# Patient Record
Sex: Male | Born: 1955 | Race: White | Hispanic: No | Smoking: Former smoker
Health system: Southern US, Community
[De-identification: ages and names within clinical notes are randomized; demographics above are authoritative.]

## PROBLEM LIST (undated history)

## (undated) DIAGNOSIS — G629 Polyneuropathy, unspecified: Secondary | ICD-10-CM

## (undated) DIAGNOSIS — J45909 Unspecified asthma, uncomplicated: Secondary | ICD-10-CM

## (undated) DIAGNOSIS — K219 Gastro-esophageal reflux disease without esophagitis: Secondary | ICD-10-CM

## (undated) HISTORY — PX: TONSILLECTOMY: SUR1361

---

## 1998-10-18 ENCOUNTER — Ambulatory Visit (HOSPITAL_COMMUNITY): Admission: RE | Admit: 1998-10-18 | Discharge: 1998-10-18 | Payer: Self-pay | Admitting: Psychiatry

## 1998-11-30 ENCOUNTER — Ambulatory Visit (HOSPITAL_COMMUNITY): Admission: RE | Admit: 1998-11-30 | Discharge: 1998-11-30 | Payer: Self-pay | Admitting: Psychiatry

## 1998-12-05 ENCOUNTER — Ambulatory Visit: Admission: RE | Admit: 1998-12-05 | Discharge: 1998-12-05 | Payer: Self-pay | Admitting: Psychiatry

## 1998-12-06 ENCOUNTER — Ambulatory Visit: Admission: RE | Admit: 1998-12-06 | Discharge: 1998-12-06 | Payer: Self-pay | Admitting: Psychiatry

## 2000-02-20 ENCOUNTER — Other Ambulatory Visit (HOSPITAL_COMMUNITY): Admission: RE | Admit: 2000-02-20 | Discharge: 2000-03-09 | Payer: Self-pay | Admitting: Psychiatry

## 2001-01-18 ENCOUNTER — Emergency Department (HOSPITAL_COMMUNITY): Admission: EM | Admit: 2001-01-18 | Discharge: 2001-01-18 | Payer: Self-pay | Admitting: Emergency Medicine

## 2003-04-22 ENCOUNTER — Encounter: Payer: Self-pay | Admitting: *Deleted

## 2003-04-22 ENCOUNTER — Emergency Department (HOSPITAL_COMMUNITY): Admission: EM | Admit: 2003-04-22 | Discharge: 2003-04-22 | Payer: Self-pay | Admitting: *Deleted

## 2010-02-26 ENCOUNTER — Emergency Department (HOSPITAL_COMMUNITY): Admission: EM | Admit: 2010-02-26 | Discharge: 2010-02-26 | Payer: Self-pay | Admitting: Emergency Medicine

## 2016-06-30 ENCOUNTER — Emergency Department (HOSPITAL_BASED_OUTPATIENT_CLINIC_OR_DEPARTMENT_OTHER): Payer: Self-pay

## 2016-06-30 ENCOUNTER — Emergency Department (HOSPITAL_BASED_OUTPATIENT_CLINIC_OR_DEPARTMENT_OTHER)
Admission: EM | Admit: 2016-06-30 | Discharge: 2016-06-30 | Disposition: A | Discharge status: Home / Self Care | Payer: Self-pay | Attending: Emergency Medicine | Admitting: Emergency Medicine

## 2016-06-30 DIAGNOSIS — Y9389 Activity, other specified: Secondary | ICD-10-CM | POA: Insufficient documentation

## 2016-06-30 DIAGNOSIS — S52552A Other extraarticular fracture of lower end of left radius, initial encounter for closed fracture: Secondary | ICD-10-CM | POA: Insufficient documentation

## 2016-06-30 DIAGNOSIS — W11XXXA Fall on and from ladder, initial encounter: Secondary | ICD-10-CM | POA: Insufficient documentation

## 2016-06-30 DIAGNOSIS — Y990 Civilian activity done for income or pay: Secondary | ICD-10-CM | POA: Insufficient documentation

## 2016-06-30 DIAGNOSIS — T148XXA Other injury of unspecified body region, initial encounter: Secondary | ICD-10-CM

## 2016-06-30 DIAGNOSIS — Z5181 Encounter for therapeutic drug level monitoring: Secondary | ICD-10-CM | POA: Insufficient documentation

## 2016-06-30 DIAGNOSIS — Y929 Unspecified place or not applicable: Secondary | ICD-10-CM | POA: Insufficient documentation

## 2016-06-30 DIAGNOSIS — W19XXXA Unspecified fall, initial encounter: Secondary | ICD-10-CM

## 2016-06-30 LAB — CBC WITH DIFFERENTIAL/PLATELET
BASOS PCT: 0 %
Basophils Absolute: 0 10*3/uL (ref 0.0–0.1)
EOS ABS: 0.1 10*3/uL (ref 0.0–0.7)
EOS PCT: 1 %
HCT: 45.3 % (ref 39.0–52.0)
HEMOGLOBIN: 16 g/dL (ref 13.0–17.0)
Lymphocytes Relative: 31 %
Lymphs Abs: 2.8 10*3/uL (ref 0.7–4.0)
MCH: 30.8 pg (ref 26.0–34.0)
MCHC: 35.3 g/dL (ref 30.0–36.0)
MCV: 87.1 fL (ref 78.0–100.0)
MONOS PCT: 7 %
Monocytes Absolute: 0.6 10*3/uL (ref 0.1–1.0)
NEUTROS PCT: 61 %
Neutro Abs: 5.4 10*3/uL (ref 1.7–7.7)
PLATELETS: 251 10*3/uL (ref 150–400)
RBC: 5.2 MIL/uL (ref 4.22–5.81)
RDW: 13.8 % (ref 11.5–15.5)
WBC: 9 10*3/uL (ref 4.0–10.5)

## 2016-06-30 LAB — BASIC METABOLIC PANEL
ANION GAP: 5 (ref 5–15)
BUN: 19 mg/dL (ref 6–20)
CALCIUM: 8.8 mg/dL — AB (ref 8.9–10.3)
CO2: 24 mmol/L (ref 22–32)
CREATININE: 1.13 mg/dL (ref 0.61–1.24)
Chloride: 107 mmol/L (ref 101–111)
GLUCOSE: 172 mg/dL — AB (ref 65–99)
Potassium: 4.2 mmol/L (ref 3.5–5.1)
Sodium: 136 mmol/L (ref 135–145)

## 2016-06-30 LAB — URINALYSIS, ROUTINE W REFLEX MICROSCOPIC
BILIRUBIN URINE: NEGATIVE
Glucose, UA: NEGATIVE mg/dL
HGB URINE DIPSTICK: NEGATIVE
KETONES UR: 15 mg/dL — AB
Leukocytes, UA: NEGATIVE
NITRITE: NEGATIVE
PROTEIN: NEGATIVE mg/dL
SPECIFIC GRAVITY, URINE: 1.023 (ref 1.005–1.030)
pH: 6.5 (ref 5.0–8.0)

## 2016-06-30 LAB — RAPID URINE DRUG SCREEN, HOSP PERFORMED
Amphetamines: NOT DETECTED
Barbiturates: NOT DETECTED
Benzodiazepines: NOT DETECTED
COCAINE: NOT DETECTED
OPIATES: POSITIVE — AB
TETRAHYDROCANNABINOL: POSITIVE — AB

## 2016-06-30 LAB — ETHANOL: Alcohol, Ethyl (B): <5 mg/dL (ref <5)

## 2016-06-30 LAB — TROPONIN I: Troponin I: <0.03 ng/mL (ref <0.03)

## 2016-06-30 MED ORDER — ACETAMINOPHEN 500 MG PO TABS: 1000.0000 mg | ORAL_TABLET | Freq: Four times a day (QID) | ORAL | 0 refills | Status: AC | PRN

## 2016-06-30 MED ORDER — TRAMADOL HCL 50 MG PO TABS: 100.0000 mg | ORAL_TABLET | Freq: Four times a day (QID) | ORAL | 0 refills | Status: AC | PRN

## 2016-06-30 MED ORDER — LIDOCAINE HCL (PF) 1 % IJ SOLN
INTRAMUSCULAR | Status: AC
  Filled 2016-06-30: qty 5

## 2016-06-30 MED ORDER — LIDOCAINE HCL (PF) 1 % IJ SOLN
10.0000 mL | Freq: Once | INTRAMUSCULAR | Status: AC
  Administered 2016-06-30: 10 mL

## 2016-06-30 MED ORDER — HYDROMORPHONE HCL 1 MG/ML IJ SOLN
1.0000 mg | Freq: Once | INTRAMUSCULAR | Status: AC
  Administered 2016-06-30: 1 mg via INTRAVENOUS
  Filled 2016-06-30: qty 1

## 2016-06-30 MED FILL — traMADol HCL 50 MG TABS: 50 | 2 days supply | Qty: 20 | Fill #0

## 2016-06-30 NOTE — ED Provider Notes (Signed)
[facility] Provider Note   CSN: [id number] Arrival date & time: 06/30/16  1404     History   Chief Complaint Chief Complaint  Patient presents with  . Fall    HPI Anthony Barton is a 60 y.o. male.  HPI Patient reports he was working in a foyer on a 20 foot ladder. He reports his feet were about 10 feet off the ground. The ladder started to slip and he was trying to support himself but could not stop the fall. He ended up falling and breaking his fall with his left arm. He has severe pain in his left wrist. He denies he has pain elsewhere. Patient denied loss of consciousness or striking his head. He denies chest pain or shortness of breath. He denies abdominal pain.  The bystander who was present reported differently that the patient after falling seemed confused and dazed. She is unsure if he has struck his head but was concerned that he might have a concussion based on his appearance. At the house they had gotten up and walk around the patient did not have any gait dysfunction She reports once they got to the hospital he was back to normal again. No past medical history on file.  There are no active problems to display for this patient.   No past surgical history on file.     Home Medications    Prior to Admission medications   Medication Sig Start Date End Date Taking? Authorizing Provider  acetaminophen (TYLENOL) 500 MG tablet Take 2 tablets (1,000 mg total) by mouth every 6 (six) hours as needed. 06/30/16   Arby Barrette, MD  traMADol (ULTRAM) 50 MG tablet Take 2 tablets (100 mg total) by mouth every 6 (six) hours as needed. 06/30/16   Arby Barrette, MD    Family History No family history on file.  Social History Social History  Substance Use Topics  . Smoking status: Not on file  . Smokeless tobacco: Not on file  . Alcohol use Not on file     Allergies   Patient has no known allergies.   Review of Systems Review of Systems 10 Systems  reviewed and are negative for acute change except as noted in the HPI.   Physical Exam Updated Vital Signs BP 162/95   Pulse 86   Temp 98.1 F (36.7 C) (Oral)   Resp 17   SpO2 100%   Physical Exam  Constitutional: He is oriented to person, place, and time. He appears well-developed and well-nourished.  Patient is alert and nontoxic. He has no respiratory distress.  HENT:  Head: Normocephalic and atraumatic.  Right Ear: External ear normal.  Left Ear: External ear normal.  Nose: Nose normal.  Mouth/Throat: Oropharynx is clear and moist.  Eyes: Conjunctivae and EOM are normal. Pupils are equal, round, and reactive to light.  Neck:  No C-spine tenderness to palpation.  Cardiovascular: Normal rate, regular rhythm, normal heart sounds and intact distal pulses.   Pulmonary/Chest: Effort normal and breath sounds normal. He exhibits no tenderness.  Abdominal: Soft. He exhibits no distension. There is no tenderness. There is no guarding.  Musculoskeletal:  Patient has obvious deformity of the left wrist with dinner fork deformity. Radial pulses intact. Patient is able to move his fingers. Remainder of extremities normal range of motion and no deformity.  Neurological: He is alert and oriented to person, place, and time. No cranial nerve deficit or sensory deficit. He exhibits normal muscle tone.  Skin: Skin is  warm and dry.  Psychiatric: He has a normal mood and affect.     ED Treatments / Results  Labs (all labs ordered are listed, but only abnormal results are displayed) Labs Reviewed  BASIC METABOLIC PANEL - Abnormal; Notable for the following:       Result Value   Glucose, Bld 172 (*)    Calcium 8.8 (*)    All other components within normal limits  ETHANOL  CBC WITH DIFFERENTIAL/PLATELET  TROPONIN I  URINALYSIS, ROUTINE W REFLEX MICROSCOPIC (NOT AT [facility])  RAPID URINE DRUG SCREEN, HOSP PERFORMED    EKG  EKG Interpretation  Date/Time:  Monday June 30 2016 14:19:32  EST Ventricular Rate:  101 PR Interval:    QRS Duration: 98 QT Interval:  347 QTC Calculation: 450 R Axis:   56 Text Interpretation:  Sinus tachycardia Ventricular premature complex Confirmed by Donnald GarrePfeiffer, MD, Lebron ConnersMarcy [phone]([id number]) on 06/30/2016 5:08:53 PM       Radiology Dg Chest 2 View  Result Date: 06/30/2016 CLINICAL DATA:  Fall. EXAM: CHEST  2 VIEW COMPARISON:  No recent prior. FINDINGS: Mediastinum and hilar structures normal. Lungs are clear. No pleural effusion or pneumothorax. Degenerative changes thoracic spine. IMPRESSION: No acute cardiopulmonary disease. Electronically Signed   By: Maisie Fushomas  Register   On: 06/30/2016 15:24   Dg Wrist 2 Views Left  Result Date: 06/30/2016 CLINICAL DATA:  Distal radial fracture status post reduction. EXAM: LEFT WRIST - 2 VIEW COMPARISON:  06/30/2016 FINDINGS: Comminuted fracture of the distal radial metaphysis with 7 mm of dorsal displacement. Surrounding soft tissue swelling. Small bony fragment along the dorsal aspect of the lunate which may reflect a small nondisplaced fracture of the lunate or triquetrum. Overlying soft tissue swelling. No other fracture or dislocation. IMPRESSION: 1. Comminuted fracture of the distal radial metaphysis with 7 mm of dorsal displacement. No significant interval change compared with with pre-reduction x-rays. 2. Small bony fragment along the dorsal aspect of the lunate which may reflect a small nondisplaced fracture of the lunate or triquetrum. Overlying soft tissue swelling. Electronically Signed   By: Elige KoHetal  Patel   On: 06/30/2016 16:40   Dg Wrist Complete Left  Result Date: 06/30/2016 CLINICAL DATA:  Left wrist pain after fall. EXAM: LEFT WRIST - COMPLETE 3+ VIEW COMPARISON:  None. FINDINGS: Comminuted fracture is seen involving the distal left radius with moderate dorsal angulation. No other definite fracture or dislocation is noted. Joint spaces are intact. IMPRESSION: Comminuted distal left radial fracture with dorsal  angulation. Electronically Signed   By: Lupita RaiderJames  Green Jr, M.D.   On: 06/30/2016 15:26    Procedures Reduction of fracture Date/Time: 06/30/2016 5:14 PM Performed by: Arby BarrettePFEIFFER, Rayner Erman Authorized by: Arby BarrettePFEIFFER, Conor Lata  Consent: Verbal consent obtained. Patient identity confirmed: verbally with patient Local anesthesia used: yes Anesthesia: hematoma block  Anesthesia: Local anesthesia used: yes Local Anesthetic: lidocaine 1% without epinephrine Anesthetic total: 10 mL  Sedation: Patient sedated: no Patient tolerance: Patient tolerated the procedure well with no immediate complications Comments: Postreduction x-ray reviewed by myself. Improved alignment. Splint placed with [employer]. Patient has good radial pulse and motor intact postreduction.    (including critical care time)  Medications Ordered in ED Medications  HYDROmorphone (DILAUDID) injection 1 mg (1 mg Intravenous Given 06/30/16 1438)  lidocaine (PF) (XYLOCAINE) 1 % injection 10 mL (10 mLs Other Given 06/30/16 1606)     Initial Impression / Assessment and Plan / ED Course  I have reviewed the triage vital signs and the nursing notes.  Pertinent labs & imaging results that were available during my care of the patient were reviewed by me and considered in my medical decision making (see chart for details).  Clinical Course      Final Clinical Impressions(s) / ED Diagnoses   Final diagnoses:  Other closed extra-articular fracture of distal end of left radius, initial encounter  Fall, initial encounter   Initially upon speaking the patient, he appeared to only have wrist injury from his fall. He did not recall striking his head and denied any headache. The bystander however reported that he had a period of confusion and although she did not see the fall, wondered if he had a concussion. For this reason, I did order CT scan of the head and the C-spine. The patient however refused these stating that he does not have symptoms  and does not suspect he has injury and is uninsured and thus cannot afford them. Patient has been in the emergency department and observed for several hours and shows no signs of confusion, he has not developed headache or any other neurologic symptoms. Patient opined that his behavior might of been in response to severe pain in his wrist. Patient shows no signs of intoxication. I do not suspect any alcohol or drug use. At this time, after observation, clinically patient does not show signs of head or neck injury. Patient's wrist is reduced and splinted, he is given instructions on follow-up with hand surgery. New Prescriptions New Prescriptions   ACETAMINOPHEN (TYLENOL) 500 MG TABLET    Take 2 tablets (1,000 mg total) by mouth every 6 (six) hours as needed.   TRAMADOL (ULTRAM) 50 MG TABLET    Take 2 tablets (100 mg total) by mouth every 6 (six) hours as needed.     Arby BarretteMarcy Aelyn Stanaland, MD 06/30/16 [id number]

## 2016-06-30 NOTE — ED Notes (Signed)
ED Provider along with EMT at bedside. Splinting.

## 2016-06-30 NOTE — ED Notes (Signed)
Pt dressed in paper scrubs. Belongings placed in personal bag.  Pt verbalized understanding of discharge instructions and denies any further questions at this time.

## 2016-06-30 NOTE — ED Notes (Signed)
Pt has been brought back from Radiology. Per [employer] Tech pt declines x-rays of knee and tib fib and CT scan at this time. EDP notified.

## 2016-07-02 ENCOUNTER — Ambulatory Visit (HOSPITAL_COMMUNITY): Payer: Self-pay | Admitting: Certified Registered Nurse Anesthetist

## 2016-07-02 ENCOUNTER — Encounter (HOSPITAL_COMMUNITY)
Admission: RE | Disposition: A | Discharge status: Home / Self Care | Payer: Self-pay | Source: Ambulatory Visit | Attending: Orthopedic Surgery

## 2016-07-02 ENCOUNTER — Encounter (HOSPITAL_COMMUNITY): Payer: Self-pay | Admitting: *Deleted

## 2016-07-02 ENCOUNTER — Ambulatory Visit (HOSPITAL_COMMUNITY)
Admission: RE | Admit: 2016-07-02 | Discharge: 2016-07-02 | Disposition: A | Discharge status: Home / Self Care | Payer: Self-pay | Source: Ambulatory Visit | Attending: Orthopedic Surgery | Admitting: Orthopedic Surgery

## 2016-07-02 DIAGNOSIS — W11XXXA Fall on and from ladder, initial encounter: Secondary | ICD-10-CM | POA: Insufficient documentation

## 2016-07-02 DIAGNOSIS — S52572A Other intraarticular fracture of lower end of left radius, initial encounter for closed fracture: Secondary | ICD-10-CM | POA: Insufficient documentation

## 2016-07-02 HISTORY — DX: Gastro-esophageal reflux disease without esophagitis: K21.9

## 2016-07-02 HISTORY — DX: Unspecified asthma, uncomplicated: J45.909

## 2016-07-02 HISTORY — PX: OPEN REDUCTION INTERNAL FIXATION (ORIF) DISTAL RADIAL FRACTURE: SHX5989

## 2016-07-02 HISTORY — DX: Polyneuropathy, unspecified: G62.9

## 2016-07-02 SURGERY — OPEN REDUCTION INTERNAL FIXATION (ORIF) DISTAL RADIUS FRACTURE
Anesthesia: Regional | Site: Wrist | Laterality: Left

## 2016-07-02 MED ORDER — PROPOFOL 500 MG/50ML IV EMUL
INTRAVENOUS | Status: DC | PRN
  Administered 2016-07-02: 200 ug/kg/min via INTRAVENOUS

## 2016-07-02 MED ORDER — BUPIVACAINE-EPINEPHRINE (PF) 0.5% -1:200000 IJ SOLN
INTRAMUSCULAR | Status: DC | PRN
  Administered 2016-07-02: 15 mL via PERINEURAL

## 2016-07-02 MED ORDER — 0.9 % SODIUM CHLORIDE (POUR BTL) OPTIME
TOPICAL | Status: DC | PRN
  Administered 2016-07-02: 1000 mL

## 2016-07-02 MED ORDER — OXYCODONE-ACETAMINOPHEN 5-325 MG PO TABS: 1.0000 | ORAL_TABLET | Freq: Three times a day (TID) | ORAL | 0 refills | Status: AC

## 2016-07-02 MED ORDER — CEFAZOLIN SODIUM-DEXTROSE 2-4 GM/100ML-% IV SOLN
2.0000 g | INTRAVENOUS | Status: AC
  Administered 2016-07-02: 2 g via INTRAVENOUS
  Filled 2016-07-02: qty 100

## 2016-07-02 MED ORDER — ROPIVACAINE HCL 7.5 MG/ML IJ SOLN
INTRAMUSCULAR | Status: DC | PRN
  Administered 2016-07-02: 15 mL via PERINEURAL

## 2016-07-02 MED ORDER — CHLORHEXIDINE GLUCONATE 4 % EX LIQD: 60.0000 mL | Freq: Once | CUTANEOUS | Status: DC

## 2016-07-02 MED ORDER — BUPIVACAINE HCL (PF) 0.25 % IJ SOLN
INTRAMUSCULAR | Status: AC
  Filled 2016-07-02: qty 30

## 2016-07-02 MED ORDER — METHOCARBAMOL 500 MG PO TABS: 500.0000 mg | ORAL_TABLET | Freq: Four times a day (QID) | ORAL | 0 refills | Status: AC

## 2016-07-02 MED ORDER — LACTATED RINGERS IV SOLN
INTRAVENOUS | Status: DC
Start: 2016-07-02 — End: 2016-07-02
  Administered 2016-07-02 (×2): via INTRAVENOUS

## 2016-07-02 MED ORDER — HYDROMORPHONE HCL 1 MG/ML IJ SOLN: 0.2500 mg | INTRAMUSCULAR | Status: DC | PRN

## 2016-07-02 MED ORDER — LIDOCAINE 2% (20 MG/ML) 5 ML SYRINGE
INTRAMUSCULAR | Status: DC | PRN
  Administered 2016-07-02: 60 mg via INTRAVENOUS

## 2016-07-02 MED ORDER — DOCUSATE SODIUM 100 MG PO CAPS: 100.0000 mg | ORAL_CAPSULE | Freq: Two times a day (BID) | ORAL | 0 refills | Status: AC

## 2016-07-02 MED ORDER — OXYCODONE HCL 5 MG/5ML PO SOLN: 5.0000 mg | Freq: Once | ORAL | Status: DC | PRN

## 2016-07-02 MED ORDER — BUPIVACAINE HCL (PF) 0.25 % IJ SOLN
INTRAMUSCULAR | Status: DC | PRN
  Administered 2016-07-02: .09 mL

## 2016-07-02 MED ORDER — VITAMIN C 500 MG PO TABS
500.0000 mg | ORAL_TABLET | Freq: Every day | ORAL | 0 refills | Status: AC
Start: 2016-07-02 — End: ?

## 2016-07-02 MED ORDER — EPHEDRINE SULFATE 50 MG/ML IJ SOLN
INTRAMUSCULAR | Status: DC | PRN
  Administered 2016-07-02: 10 mg via INTRAVENOUS
  Administered 2016-07-02: 5 mg via INTRAVENOUS

## 2016-07-02 MED ORDER — OXYCODONE HCL 5 MG PO TABS: 5.0000 mg | ORAL_TABLET | Freq: Once | ORAL | Status: DC | PRN

## 2016-07-02 MED ORDER — FENTANYL CITRATE (PF) 100 MCG/2ML IJ SOLN
100.0000 ug | Freq: Once | INTRAMUSCULAR | Status: AC
  Administered 2016-07-02: 100 ug via INTRAVENOUS

## 2016-07-02 SURGICAL SUPPLY — 66 items
BANDAGE ACE 4X5 VEL STRL LF (GAUZE/BANDAGES/DRESSINGS) ×3 IMPLANT
BANDAGE ELASTIC 3 VELCRO ST LF (GAUZE/BANDAGES/DRESSINGS) ×3 IMPLANT
BIT DRILL 2.2 SS TIBIAL (BIT) ×2 IMPLANT
BLADE SURG ROTATE 9660 (MISCELLANEOUS) IMPLANT
BNDG CMPR 9X4 STRL LF SNTH (GAUZE/BANDAGES/DRESSINGS) ×1
BNDG ESMARK 4X9 LF (GAUZE/BANDAGES/DRESSINGS) ×3 IMPLANT
BNDG GAUZE ELAST 4 BULKY (GAUZE/BANDAGES/DRESSINGS) ×3 IMPLANT
CANISTER SUCTION 2500CC (MISCELLANEOUS) ×3 IMPLANT
CORDS BIPOLAR (ELECTRODE) ×3 IMPLANT
COVER SURGICAL LIGHT HANDLE (MISCELLANEOUS) ×3 IMPLANT
CUFF TOURNIQUET SINGLE 18IN (TOURNIQUET CUFF) ×3 IMPLANT
CUFF TOURNIQUET SINGLE 24IN (TOURNIQUET CUFF) IMPLANT
DECANTER SPIKE VIAL GLASS SM (MISCELLANEOUS) ×3 IMPLANT
DRAPE OEC MINIVIEW 54X84 (DRAPES) ×3 IMPLANT
DRAPE SURG 17X11 SM STRL (DRAPES) ×3 IMPLANT
DRSG ADAPTIC 3X8 NADH LF (GAUZE/BANDAGES/DRESSINGS) ×3 IMPLANT
GAUZE SPONGE 4X4 12PLY STRL (GAUZE/BANDAGES/DRESSINGS) ×3 IMPLANT
GAUZE SPONGE 4X4 16PLY XRAY LF (GAUZE/BANDAGES/DRESSINGS) ×3 IMPLANT
GAUZE XEROFORM 1X8 LF (GAUZE/BANDAGES/DRESSINGS) ×2 IMPLANT
GLOVE BIOGEL PI IND STRL 8.5 (GLOVE) ×1 IMPLANT
GLOVE BIOGEL PI INDICATOR 8.5 (GLOVE) ×2
GLOVE ECLIPSE 7.0 STRL STRAW (GLOVE) ×2 IMPLANT
GLOVE SURG ORTHO 8.0 STRL STRW (GLOVE) ×3 IMPLANT
GOWN STRL REUS W/ TWL LRG LVL3 (GOWN DISPOSABLE) ×1 IMPLANT
GOWN STRL REUS W/ TWL XL LVL3 (GOWN DISPOSABLE) ×1 IMPLANT
GOWN STRL REUS W/TWL LRG LVL3 (GOWN DISPOSABLE) ×3
GOWN STRL REUS W/TWL XL LVL3 (GOWN DISPOSABLE) ×3
K-WIRE 1.6 (WIRE) ×3
K-WIRE FX5X1.6XNS BN SS (WIRE) ×1
KIT BASIN OR (CUSTOM PROCEDURE TRAY) ×3 IMPLANT
KIT ROOM TURNOVER OR (KITS) ×3 IMPLANT
KWIRE FX5X1.6XNS BN SS (WIRE) IMPLANT
NDL HYPO 25GX1X1/2 BEV (NEEDLE) IMPLANT
NDL HYPO 25X1 1.5 SAFETY (NEEDLE) ×1 IMPLANT
NEEDLE HYPO 25GX1X1/2 BEV (NEEDLE) ×3 IMPLANT
NEEDLE HYPO 25X1 1.5 SAFETY (NEEDLE) ×3 IMPLANT
NS IRRIG 1000ML POUR BTL (IV SOLUTION) ×3 IMPLANT
PACK ORTHO EXTREMITY (CUSTOM PROCEDURE TRAY) ×3 IMPLANT
PAD ARMBOARD 7.5X6 YLW CONV (MISCELLANEOUS) ×6 IMPLANT
PAD CAST 4YDX4 CTTN HI CHSV (CAST SUPPLIES) ×1 IMPLANT
PADDING CAST COTTON 4X4 STRL (CAST SUPPLIES) ×3
PEG LOCKING SMOOTH 2.2X18 (Peg) ×6 IMPLANT
PEG LOCKING SMOOTH 2.2X22 (Screw) ×4 IMPLANT
PEG LOCKING SMOOTH 2.2X24 (Peg) ×2 IMPLANT
PLATE STD DVR LEFT (Plate) ×3 IMPLANT
PLATE STD DVR LT 24X55 (Plate) IMPLANT
SCREW LOCK 14X2.7X 3 LD TPR (Screw) IMPLANT
SCREW LOCK 16X2.7X 3 LD TPR (Screw) IMPLANT
SCREW LOCKING 2.7X14 (Screw) ×3 IMPLANT
SCREW LOCKING 2.7X15MM (Screw) ×6 IMPLANT
SCREW LOCKING 2.7X16 (Screw) ×3 IMPLANT
SCREW MULTI DIRECTIONAL 2.7X20 (Screw) ×2 IMPLANT
SCREW MULTI DIRECTIONAL 2.7X22 (Screw) ×2 IMPLANT
SOAP 2 % CHG 4 OZ (WOUND CARE) ×3 IMPLANT
SPLINT FIBERGLASS 3X35 (CAST SUPPLIES) ×2 IMPLANT
SPONGE LAP 4X18 X RAY DECT (DISPOSABLE) ×3 IMPLANT
SUT VIC AB 2-0 FS1 27 (SUTURE) ×2 IMPLANT
SUT VICRYL 4-0 PS2 18IN ABS (SUTURE) ×2 IMPLANT
SUT VICRYL RAPIDE 4/0 PS 2 (SUTURE) ×4 IMPLANT
SYR CONTROL 10ML LL (SYRINGE) ×2 IMPLANT
TOWEL OR 17X24 6PK STRL BLUE (TOWEL DISPOSABLE) ×3 IMPLANT
TOWEL OR 17X26 10 PK STRL BLUE (TOWEL DISPOSABLE) ×3 IMPLANT
TUBE CONNECTING 12'X1/4 (SUCTIONS) ×1
TUBE CONNECTING 12X1/4 (SUCTIONS) ×2 IMPLANT
WATER STERILE IRR 1000ML POUR (IV SOLUTION) ×3 IMPLANT
YANKAUER SUCT BULB TIP NO VENT (SUCTIONS) ×2 IMPLANT

## 2016-07-02 NOTE — Progress Notes (Signed)
Orthopedic Tech Progress Note Patient Details:  Anthony Barton 05/27/1956 [id number]  Ortho Devices Type of Ortho Device: Arm sling Ortho Device/Splint Interventions: Casandra DoffingOrdered   Binyamin Nelis Craig 07/02/2016, 7:08 PM

## 2016-07-02 NOTE — Transfer of Care (Signed)
Immediate Anesthesia Transfer of Care Note  Patient: Anthony Barton  Procedure(s) Performed: Procedure(s): OPEN REDUCTION INTERNAL FIXATION (ORIF) DISTAL RADIAL FRACTURE (Left)  Patient Location: PACU  Anesthesia Type:MAC combined with regional for post-op pain  Level of Consciousness: awake, alert , oriented and patient cooperative  Airway & Oxygen Therapy: Patient Spontanous Breathing and Patient connected to face mask oxygen  Post-op Assessment: Report given to RN and Post -op Vital signs reviewed and stable  Post vital signs: Reviewed and stable  Last Vitals:  Vitals:   07/02/16 1600 07/02/16 1817  BP: (!) 174/73   Pulse: 63   Resp: 18   Temp: 36.7 C 36.4 C    Last Pain:  Vitals:   07/02/16 1600  TempSrc: Oral      Patients Stated Pain Goal: 7 (07/02/16 1610)  Complications: No apparent anesthesia complications

## 2016-07-02 NOTE — Anesthesia Preprocedure Evaluation (Addendum)
Anesthesia Evaluation  Patient identified by MRN, date of birth, ID band Patient awake    Reviewed: Allergy & Precautions, NPO status , Patient's Chart, lab work & pertinent test results  History of Anesthesia Complications Negative for: history of anesthetic complications  Airway Mallampati: II  TM Distance: >3 FB Neck ROM: Full    Dental  (+) Teeth Intact   Pulmonary neg shortness of breath, neg sleep apnea, neg COPD, neg recent URI, former smoker,    breath sounds clear to auscultation       Cardiovascular negative cardio ROS   Rhythm:Regular     Neuro/Psych  Neuromuscular disease negative psych ROS   GI/Hepatic Neg liver ROS, GERD  ,  Endo/Other  negative endocrine ROS  Renal/GU negative Renal ROS     Musculoskeletal   Abdominal   Peds  Hematology   Anesthesia Other Findings   Reproductive/Obstetrics                            Anesthesia Physical Anesthesia Plan  ASA: II  Anesthesia Plan: Regional   Post-op Pain Management:    Induction:   Airway Management Planned: Natural Airway, Nasal Cannula and Simple Face Mask  Additional Equipment: None  Intra-op Plan:   Post-operative Plan: Extubation in OR  Informed Consent: I have reviewed the patients History and Physical, chart, labs and discussed the procedure including the risks, benefits and alternatives for the proposed anesthesia with the patient or authorized representative who has indicated his/her understanding and acceptance.   Dental advisory given  Plan Discussed with: CRNA and Surgeon  Anesthesia Plan Comments:        Anesthesia Quick Evaluation

## 2016-07-02 NOTE — H&P (Signed)
Anthony Barton is an 60 y.o. male.   Chief Complaint: COLLES FRACTURE OF THE LEFT DISTAL RADIUS HPI: PATIENT FELL OFF A LADDER ON 06/30/16. SEEN AT THE ED THAT DAY AND WAS PUT IN A SPLINT. SEEN IN THE OFFICE 07/02/16. REPEAT RADIOGRAPHS DONE. PATIENT IS HERE TODAY FOR SURGERY.  No past medical history on file.  No past surgical history on file.  No family history on file. Social History:  has no tobacco, alcohol, and drug history on file.  Allergies: No Known Allergies  No prescriptions prior to admission.    Results for orders placed or performed during the hospital encounter of 06/30/16 (from the past 48 hour(s))  Urinalysis, Routine w reflex microscopic     Status: Abnormal   Collection Time: 06/30/16  5:00 PM  Result Value Ref Range   Color, Urine YELLOW YELLOW   APPearance CLEAR CLEAR   Specific Gravity, Urine 1.023 1.005 - 1.030   pH 6.5 5.0 - 8.0   Glucose, UA NEGATIVE NEGATIVE mg/dL   Hgb urine dipstick NEGATIVE NEGATIVE   Bilirubin Urine NEGATIVE NEGATIVE   Ketones, ur 15 (A) NEGATIVE mg/dL   Protein, ur NEGATIVE NEGATIVE mg/dL   Nitrite NEGATIVE NEGATIVE   Leukocytes, UA NEGATIVE NEGATIVE    Comment: MICROSCOPIC NOT DONE ON URINES WITH NEGATIVE PROTEIN, BLOOD, LEUKOCYTES, NITRITE, OR GLUCOSE <1000 mg/dL.  Urine rapid drug screen (hosp performed)     Status: Abnormal   Collection Time: 06/30/16  5:00 PM  Result Value Ref Range   Opiates POSITIVE (A) NONE DETECTED   Cocaine NONE DETECTED NONE DETECTED   Benzodiazepines NONE DETECTED NONE DETECTED   Amphetamines NONE DETECTED NONE DETECTED   Tetrahydrocannabinol POSITIVE (A) NONE DETECTED   Barbiturates NONE DETECTED NONE DETECTED    Comment:        DRUG SCREEN FOR MEDICAL PURPOSES ONLY.  IF CONFIRMATION IS NEEDED FOR ANY PURPOSE, NOTIFY LAB WITHIN 5 DAYS.        LOWEST DETECTABLE LIMITS FOR URINE DRUG SCREEN Drug Class       Cutoff (ng/mL) Amphetamine      1000 Barbiturate      200 Benzodiazepine    200 Tricyclics       300 Opiates          300 Cocaine          300 THC              50    Dg Wrist 2 Views Left  Result Date: 06/30/2016 CLINICAL DATA:  Distal radial fracture status post reduction. EXAM: LEFT WRIST - 2 VIEW COMPARISON:  06/30/2016 FINDINGS: Comminuted fracture of the distal radial metaphysis with 7 mm of dorsal displacement. Surrounding soft tissue swelling. Small bony fragment along the dorsal aspect of the lunate which may reflect a small nondisplaced fracture of the lunate or triquetrum. Overlying soft tissue swelling. No other fracture or dislocation. IMPRESSION: 1. Comminuted fracture of the distal radial metaphysis with 7 mm of dorsal displacement. No significant interval change compared with with pre-reduction x-rays. 2. Small bony fragment along the dorsal aspect of the lunate which may reflect a small nondisplaced fracture of the lunate or triquetrum. Overlying soft tissue swelling. Electronically Signed   By: Elige KoHetal  Patel   On: 06/30/2016 16:40    ROS NO RECENT ILLNESSES OR HOSPITALIZATIONS.  There were no vitals taken for this visit. Physical Exam  General Appearance:  Alert, cooperative, no distress, appears stated age  Head:  Normocephalic, without obvious abnormality, atraumatic  Eyes:  Pupils equal, conjunctiva/corneas clear,         Throat: Lips, mucosa, and tongue normal; teeth and gums normal  Neck: No visible masses     Lungs:   respirations unlabored  Chest Wall:  No tenderness or deformity  Heart:  Regular rate and rhythm,  Abdomen:   Soft, non-tender,         Extremities: LUE: SKIN INTACT, ABLE TO EXTEND THUMB AND WIGGLE FINGERS FINGERS WARM WELL PERFUSED SPLINT IN PLACE  Pulses: 2+ and symmetric  Skin: Skin color, texture, turgor normal, no rashes or lesions     Neurologic: Normal    Assessment COMMINUTED INTRAARTICULAR  FRACTURE OF THE LEFT DISTAL RADIUS  Plan OPEN REDUCTION AND INTERNAL FIXATION OF THE LEFT DISTAL RADIUS R/B/A  DISCUSSED WITH PT IN OFFICE.  PT VOICED UNDERSTANDING OF PLAN CONSENT SIGNED DAY OF SURGERY PT SEEN AND EXAMINED PRIOR TO OPERATIVE PROCEDURE/DAY OF SURGERY SITE MARKED. QUESTIONS ANSWERED WILL GO HOME FOLLOWING SURGERY  WE ARE PLANNING SURGERY FOR YOUR UPPER EXTREMITY. THE RISKS AND BENEFITS OF SURGERY INCLUDE BUT NOT LIMITED TO BLEEDING INFECTION, DAMAGE TO NEARBY NERVES ARTERIES TENDONS, FAILURE OF SURGERY TO ACCOMPLISH ITS INTENDED GOALS, PERSISTENT SYMPTOMS AND NEED FOR FURTHER SURGICAL INTERVENTION. WITH THIS IN MIND WE WILL PROCEED. I HAVE DISCUSSED WITH THE PATIENT THE PRE AND POSTOPERATIVE REGIMEN AND THE DOS AND DON'TS. PT VOICED UNDERSTANDING AND INFORMED CONSENT SIGNED.   Anthony GreaserSamantha Bonham Barton 07/02/2016, 3:21 PM

## 2016-07-02 NOTE — Anesthesia Procedure Notes (Signed)
Procedure Name: MAC Date/Time: 07/02/2016 4:51 PM Performed by: Lucinda DellECARLO, Fernado Brigante M Pre-anesthesia Checklist: Patient identified, Suction available, Patient being monitored and Emergency Drugs available Oxygen Delivery Method: Simple face mask

## 2016-07-02 NOTE — Anesthesia Procedure Notes (Addendum)
Anesthesia Regional Block:  Supraclavicular block  Pre-Anesthetic Checklist: ,, timeout performed, Correct Patient, Correct Site, Correct Laterality, Correct Procedure, Correct Position, site marked, Risks and benefits discussed,  Surgical consent,  Pre-op evaluation,  At surgeon's request and post-op pain management  Laterality: Upper and Left  Prep: chloraprep       Needles:  Injection technique: Single-shot  Needle Type: Echogenic Needle          Additional Needles:  Procedures: ultrasound guided (picture in chart) Supraclavicular block Narrative:  Start time: 07/02/2016 4:36 PM End time: 07/02/2016 4:45 PM Injection made incrementally with aspirations every 5 mL.  Performed by: Personally   Additional Notes: H+P and labs reviewed, risks and benefits discussed with patient, procedure tolerated well without complications

## 2016-07-02 NOTE — Discharge Instructions (Signed)
KEEP BANDAGE CLEAN AND DRY CALL OFFICE FOR F/U APPT [phone] IN 14 DAYS KEEP HAND ELEVATED ABOVE HEART OK TO APPLY ICE TO OPERATIVE AREA CONTACT OFFICE IF ANY WORSENING PAIN OR CONCERNS. 

## 2016-07-02 NOTE — Anesthesia Postprocedure Evaluation (Signed)
Anesthesia Post Note  Patient: Anthony Barton  Procedure(s) Performed: Procedure(s) (LRB): OPEN REDUCTION INTERNAL FIXATION (ORIF) DISTAL RADIAL FRACTURE (Left)  Patient location during evaluation: PACU Anesthesia Type: Regional Level of consciousness: awake and alert, oriented and patient cooperative Pain management: pain level controlled Vital Signs Assessment: post-procedure vital signs reviewed and stable Respiratory status: spontaneous breathing, nonlabored ventilation and respiratory function stable Cardiovascular status: blood pressure returned to baseline and stable Postop Assessment: no signs of nausea or vomiting Anesthetic complications: no    Last Vitals:  Vitals:   07/02/16 1900 07/02/16 1905  BP: (!) 171/66 (!) 171/66  Pulse: 61 (!) 58  Resp: 11 11  Temp:  36.4 C    Last Pain:  Vitals:   07/02/16 1845  TempSrc:   PainSc: 0-No pain                 Shaniyah Wix,E. Jaree Trinka

## 2016-07-03 ENCOUNTER — Encounter (HOSPITAL_COMMUNITY): Payer: Self-pay | Admitting: Orthopedic Surgery

## 2016-07-03 NOTE — Op Note (Signed)
NAME:  Anthony Barton, Anthony Barton           ACCOUNT NO.:  [id number]  MEDICAL RECORD NO.:  [id number]  LOCATION:                                 FACILITY:  PHYSICIAN:  Sharma CovertFred W. Torry Adamczak IV, M.D.DATE OF BIRTH:  July 05, 1956  DATE OF PROCEDURE:  07/02/2016 DATE OF DISCHARGE:  07/02/2016                              OPERATIVE REPORT   PREOPERATIVE DIAGNOSIS:  Left wrist intra-articular distal radius fracture, 3 or more fragments.  POSTOPERATIVE DIAGNOSIS:  Left wrist intra-articular distal radius fracture, 3 or more fragments.  ATTENDING SURGEON:  Sharma CovertFred W. Leni Pankonin, MD, who scrubbed and present for the entire procedure.  ASSISTANT SURGEON:  None.  ANESTHESIA:  Supraclavicular block with IV sedation.  SURGICAL PROCEDURE: 1. Open treatment of left wrist intra-articular distal radius     fracture, 3 or more fragments. 2. Left wrist brachioradialis tendon released forearm flexor tenotomy. 3. Radiographs 3 views, left wrist.  SURGICAL INDICATIONS:  Anthony Barton is a right-hand dominant gentleman who sustained a comminuted intra-articular distal radius fracture.  The patient was seen and evaluated in the office and recommended to undergo the above procedure.  Risks, benefits, and alternatives were discussed in detail with the patient.  Risks include, but not limited to bleeding, infection, damage to nearby nerves, arteries, or tendons; loss of motion to the wrist and digits, incomplete relief of symptoms, and need for further surgical intervention.  DESCRIPTION OF PROCEDURE:  The patient was properly identified in the preoperative holding area, mark with a permanent marker made on the left wrist to indicate the correct operative site.  The patient was then brought back to the operating room, placed supine on the anesthesia room table where the IV sedation was administered.  The patient tolerated this well.  A well-padded tourniquet was placed on the left brachium and sealed with 1000 drape.   Left upper extremity was then prepped and draped in normal sterile fashion.  Time-out was called, correct site was identified, and procedure then begun.  Attention was then turned to the left wrist where a longitudinal incision was made directly over the FCR sheath after prep and drape.  Tourniquet had been insufflated. Dissection carried down through the skin and subcutaneous tissue.  The FCR sheath was then opened proximally and distally.  Going through the floor of the FCR sheath, deep dissection was carried down sweeping the FPL all the way and the pronator quadratus was elevated in an L-shaped fashion.  Following this, the brachioradialis was carefully released and tendon tenotomy was then carried out releasing of the radial styloid to expose the intra-articular fracture and reduce the intra-articular fragments radially.  The remaining intra-articular fragments were then carefully reduced and volar rim plate was then applied.  Plate height was then adjusted and confirmed using mini C-arm.  The oblong screw hole was then placed proximally.  The wound was then thoroughly irrigated. Distal fixation was carried out with the distal locking pegs from an ulnar to radial direction with the combination of distal locking pegs and variable angle screws.  The proximal fixation was then carried out with locking and nonlocking screws.  The wound was irrigated, final radiographs were then obtained.  The pronator quadratus was closed with  2-0 Vicryl, subcutaneous tissue was closed with 4-0 Vicryl, skin closed with 4-0 Vicryl Rapide.  Xeroform dressing and sterile compressive bandage were then applied.  The patient was then placed in a well-padded sugar-tong splint, taken to the recovery room in good condition.  INTRAOPERATIVE RADIOGRAPHS:  AP, lateral, and oblique views of the wrist did show the volar plate fixation in place in good position.  SURGICAL IMPLANTS:  Biomet DVR Cross-Lock volar rim  plate standard.  POSTPROCEDURAL PLAN:  The patient discharged home, seen back in the office in approximately 2 weeks for wound check, application of short- arm cast.  Follow up at the 4-week mark for x-rays, x-rays out of the cast and then begin an outpatient therapy regimen.  Radiographs at each visit.     Madelynn DoneFred W. Freya Zobrist IV, M.D.   ______________________________ Madelynn DoneFred W. Wing Gfeller IV, M.D.    FWO/MEDQ  D:  07/02/2016  T:  07/03/2016  Job:  [id number]

## 2016-07-03 NOTE — Op Note (Deleted)
  The note originally documented on this encounter has been moved the the encounter in which it belongs.  

## 2016-08-04 ENCOUNTER — Ambulatory Visit: Payer: Self-pay | Admitting: *Deleted

## 2016-08-12 ENCOUNTER — Ambulatory Visit: Payer: BLUE CROSS/BLUE SHIELD | Attending: Orthopedic Surgery | Admitting: Occupational Therapy

## 2016-08-12 DIAGNOSIS — M25522 Pain in left elbow: Secondary | ICD-10-CM | POA: Diagnosis present

## 2016-08-12 DIAGNOSIS — R600 Localized edema: Secondary | ICD-10-CM | POA: Diagnosis present

## 2016-08-12 DIAGNOSIS — M25532 Pain in left wrist: Secondary | ICD-10-CM

## 2016-08-12 DIAGNOSIS — M6281 Muscle weakness (generalized): Secondary | ICD-10-CM | POA: Diagnosis present

## 2016-08-12 DIAGNOSIS — M25632 Stiffness of left wrist, not elsewhere classified: Secondary | ICD-10-CM

## 2016-08-12 DIAGNOSIS — G90522 Complex regional pain syndrome I of left lower limb: Secondary | ICD-10-CM | POA: Insufficient documentation

## 2016-08-12 NOTE — Therapy (Signed)
[facility] [address] [place], [place], [address] Phone: [phone]   Fax:  [phone]  Occupational Therapy Evaluation  Patient Details  Name: Anthony Barton MRN: [id number] Date of Birth: Mar 07, 1956 Referring Provider: Dr. Melvyn Novas  Encounter Date: 08/12/2016      OT End of Session - 08/12/16 0947    Visit Number 1   Number of Visits 12   Authorization Type BC/BS? or self pay - pt unsure   OT Start Time 0800   OT Stop Time 0850   OT Time Calculation (min) 50 min   Activity Tolerance Patient tolerated treatment well      Past Medical History:  Diagnosis Date  . Asthma   . GERD (gastroesophageal reflux disease)   . Peripheral neuropathy (HCC)    mild    Past Surgical History:  Procedure Laterality Date  . OPEN REDUCTION INTERNAL FIXATION (ORIF) DISTAL RADIAL FRACTURE Left 07/02/2016   Procedure: OPEN REDUCTION INTERNAL FIXATION (ORIF) DISTAL RADIAL FRACTURE;  Surgeon: Bradly Bienenstock, MD;  [facility]: [facility] OR;  Service: Orthopedics;  Laterality: Left;  . TONSILLECTOMY      There were no vitals filed for this visit.      Subjective Assessment - 08/12/16 0810    Subjective  I've been lifting ladders and I tried to lift a 8 lb dumbbell. I have to lift for my job   Currently in Pain? No/denies  Not during eval, but does report pain at times along forearm and sometimes up to shoulder   Pain Orientation Left   Pain Descriptors / Indicators Radiating;Dull   Pain Type Acute pain   Pain Onset More than a month ago   Pain Frequency Intermittent   Aggravating Factors  certain movements   Pain Relieving Factors rest           [facility] OT Assessment - 08/12/16 0001      Assessment   Diagnosis s/p ORIF Lt wrist Colle's fracture   Referring Provider Dr. Melvyn Novas   Onset Date 07/02/16   Assessment Pt arrived with pre-fab wrist splint. Pt reports MD instructed him to wear when active. Pt reports he has already  begun lifting for work - therapist cautioned him not to yet, but pt reports he has to get back to work d/t financial reasons.    Prior Therapy none     Precautions   Precautions Other (comment)   Precaution Comments no strengthening   Required Braces or Orthoses Other Brace/Splint   Other Brace/Splint Pt to wear pre-fab brace when pt is active     Balance Screen   Has the patient fallen in the past 6 months Yes   How many times? 1   Has the patient had a decrease in activity level because of a fear of falling?  No   Is the patient reluctant to leave their home because of a fear of falling?  No     Home  Environment   Lives With Alone     Prior Function   Level of Independence Independent   Vocation Self employed   Chartered certified accountant     ADL   Eating/Feeding Independent   Grooming Independent   Comptroller - Conservator, museum/gallery -  Tour manager Independent   ADL comments  Independent with all IADLS including driving     Mobility   Mobility Status Independent     Written Expression   Dominant Hand Right     Vision - History   Baseline Vision No visual deficits   Additional Comments wears reading glasses     Sensation   Additional Comments Pt reports numbness along median n. distribution     Coordination   9 Hole Peg Test Right;Left   Right 9 Hole Peg Test 23.78 sec   Left 9 Hole Peg Test 23.78 sec     Edema   Edema moderate Lt wrist     ROM / Strength   AROM / PROM / Strength AROM     AROM   Overall AROM Comments BUE AROM WFL's except: Lt supination = 83*, Wrist flex = 45*, ext = 35*, RD = 15*, UD = 26*. Pt also lacks full end range in elbow flexion d/t pain and therapist recommended calling MD about elbow pain                    OT Treatments/Exercises (OP) - 08/12/16 0001      ADLs   ADL Comments Pt shown scar massage and how to perform, and benefits      Exercises   Exercises Wrist     Wrist Exercises   Other wrist exercises Pt issued A/ROM wrist and forearm HEP - pt return demo of each               OT Education - 08/12/16 0848    Education provided Yes   Education Details scar massage, A/ROM HEP    Person(s) Educated Patient   Methods Explanation;Demonstration;Handout   Comprehension Verbalized understanding;Returned demonstration          OT Short Term Goals - 08/12/16 0954      OT SHORT TERM GOAL #1   Title Independent with initial A/ROM HEP    Time 3   Period Weeks   Status On-going     OT SHORT TERM GOAL #2   Title Independent with scar massage   Time 3   Period Weeks   Status On-going           OT Long Term Goals - 08/12/16 0955      OT LONG TERM GOAL #1   Title Independent with updated HEP    Time 6   Period Weeks   Status New     OT LONG TERM GOAL #2   Title Pt to increase wrist flexion and extension by 10* or greater for functional tasks   Time 6   Period Weeks   Status New     OT LONG TERM GOAL #3   Title Pt to demo 50% or greater grip strength Lt hand as compared to that of Rt hand for work related tasks   Time 6   Period Weeks   Status New     OT LONG TERM GOAL #4   Title Pt to report pain no greater than 2/10 with lifting tasks   Baseline     Time 6   Period Weeks   Status New     OT LONG TERM GOAL #5   Title Pt to return to using Lt hand as assist for all work related tasks   Time 6   Period Weeks   Status New               Plan - 08/12/16 6578  Clinical Impression Statement Pt is a 61 y.o. male who presents to outpatient rehab s/p ORIF Lt radius fx (Colles fx) on 07/02/16. Original injury 06/30/16 from fall off ladder. Pt now with increased edema, pain, and decreased sensation, ROM, and strength LUE and would benefit from  skilled O.T. to address these deficits   OT Frequency 2x / week   OT Duration 6 weeks   OT Treatment/Interventions Self-care/ADL training;Moist Heat;Fluidtherapy;DME and/or AE instruction;Splinting;Patient/family education;Therapeutic exercises;Contrast Bath;Ultrasound;Therapeutic activities;Scar mobilization;Cryotherapy;Passive range of motion;Parrafin;Electrical Stimulation;Manual Therapy   Plan fluidotherapy, review A/ROM and scar massage, forearm gym, supinator roller   Consulted and Agree with Plan of Care Patient      Patient will benefit from skilled therapeutic intervention in order to improve the following deficits and impairments:  Decreased range of motion, Increased edema, Impaired sensation, Decreased activity tolerance, Decreased knowledge of precautions, Decreased skin integrity, Impaired UE functional use, Pain, Decreased strength, Impaired perceived functional ability  Visit Diagnosis: Pain in left wrist - Plan: Ot plan of care cert/re-cert  Pain in left elbow - Plan: Ot plan of care cert/re-cert  Stiffness of left wrist, not elsewhere classified - Plan: Ot plan of care cert/re-cert  Localized edema - Plan: Ot plan of care cert/re-cert  Muscle weakness (generalized) - Plan: Ot plan of care cert/re-cert    Problem List There are no active problems to display for this patient.   Kelli ChurnBallie, Raequan Vanschaick Johnson, OTR/L 08/12/2016, 10:15 AM  [facility] [address] [place], [place], [address] Phone: [phone]   Fax:  [phone]  Name: Anthony Barton MRN: [id number] Date of Birth: 05/02/1956

## 2016-08-12 NOTE — Patient Instructions (Signed)
  SCAR MASSAGE:   Perform 2x/day for 5 minutes  Use cocoa butter or Vitamin E cream along incision massaging in deep circles  AROM: Wrist Extension   .  With __Lt__ palm down, bend wrist up. Repeat __15__ times per set.  Do __4-6__ sessions per day.    AROM: Wrist Flexion   With__Lt___ palm up, bend wrist up. Repeat __15__ times per set.  Do _4-6___ sessions per day.   AROM: Forearm Pronation / Supination   With _Lt___ arm in handshake position, slowly rotate palm down until stretch is felt. Relax. Then rotate palm up until stretch is felt. Repeat _15___ times per set. Do _4-6___ sessions per day. (elbows bent)  AROM: Wrist Radial / Ulnar Deviation    Gently bend left wrist from side to side as far as possible. Repeat _15___ times per set.  Do __4-6__ sessions per day.

## 2016-08-19 ENCOUNTER — Encounter: Payer: Self-pay | Admitting: Occupational Therapy

## 2016-08-19 ENCOUNTER — Ambulatory Visit: Payer: BLUE CROSS/BLUE SHIELD | Admitting: Occupational Therapy

## 2016-08-19 DIAGNOSIS — R600 Localized edema: Secondary | ICD-10-CM

## 2016-08-19 DIAGNOSIS — M25532 Pain in left wrist: Secondary | ICD-10-CM

## 2016-08-19 DIAGNOSIS — M6281 Muscle weakness (generalized): Secondary | ICD-10-CM

## 2016-08-19 DIAGNOSIS — M25632 Stiffness of left wrist, not elsewhere classified: Secondary | ICD-10-CM

## 2016-08-19 DIAGNOSIS — M25522 Pain in left elbow: Secondary | ICD-10-CM

## 2016-08-19 NOTE — Therapy (Signed)
[facility] [address] [place], [place], [address] Phone: [phone]   Fax:  [phone]  Occupational Therapy Treatment  Patient Details  Name: Anthony Barton MRN: [id number] Date of Birth: 12/05/1955 Referring Provider: Dr. Melvyn Novasrtmann  Encounter Date: 08/19/2016      OT End of Session - 08/19/16 1310    Visit Number 2   Number of Visits 12   Authorization Type BC/BS? or self pay - pt unsure   OT Start Time [phone]   OT Stop Time 1014   OT Time Calculation (min) 43 min   Activity Tolerance Patient tolerated treatment well      Past Medical History:  Diagnosis Date  . Asthma   . GERD (gastroesophageal reflux disease)   . Peripheral neuropathy (HCC)    mild    Past Surgical History:  Procedure Laterality Date  . OPEN REDUCTION INTERNAL FIXATION (ORIF) DISTAL RADIAL FRACTURE Left 07/02/2016   Procedure: OPEN REDUCTION INTERNAL FIXATION (ORIF) DISTAL RADIAL FRACTURE;  Surgeon: Bradly BienenstockFred Ortmann, MD;  Location: [facility] OR;  Service: Orthopedics;  Laterality: Left;  . TONSILLECTOMY      There were no vitals filed for this visit.      Subjective Assessment - 08/19/16 0931    Subjective  I overdid a little yesterday so I am a little sore today.    Pertinent History see epic , L colles fx/, s/p ORIF 07/02/2016   Currently in Pain? Yes   Pain Score 2    Pain Location Wrist   Pain Orientation Left                      OT Treatments/Exercises (OP) - 08/19/16 0001      Wrist Exercises   Other wrist exercises AAROM (gentle) for wrist flexion, extension, ulnar deviation and radial deviation x10 reps each. with 10 second hold with each rep. Also addressed supination with supination wheel.  Pt states "I don't have time during the day to do this." Discussed that pt could do prior to work , lunch, when he makes work phone calls and at night. Pt verbalized understanding and stated he could do that. Also reinforced  that pt should not be using weights at home and pt stated he stopped but does have to use his hand for work to hold items for extended periods of time that weigh about 2 pounds - pt is wearing splint doing active work.       Modalities   Modalities Fluidotherapy     LUE Fluidotherapy   Number Minutes Fluidotherapy 12 Minutes   LUE Fluidotherapy Location Wrist   Comments Tolerated well - pain decreased from 2/10 to 1/10 by end of treatment.      Manual Therapy   Manual Therapy Other (comment)   Manual therapy comments Scar massage - pt tolerated well and states he is doing this at home intermittently.                   OT Short Term Goals - 08/19/16 1309      OT SHORT TERM GOAL #1   Title Independent with initial A/ROM HEP    Time 3   Period Weeks   Status Achieved     OT SHORT TERM GOAL #2   Title Independent with scar massage   Time 3   Period Weeks   Status Achieved           OT Long Term Goals - 08/19/16  1309      OT LONG TERM GOAL #1   Title Independent with updated HEP    Time 6   Period Weeks   Status New     OT LONG TERM GOAL #2   Title Pt to increase wrist flexion and extension by 10* or greater for functional tasks   Time 6   Period Weeks   Status New     OT LONG TERM GOAL #3   Title Pt to demo 50% or greater grip strength Lt hand as compared to that of Rt hand for work related tasks   Time 6   Period Weeks   Status New     OT LONG TERM GOAL #4   Title Pt to report pain no greater than 2/10 with lifting tasks   Baseline     Time 6   Period Weeks   Status New     OT LONG TERM GOAL #5   Title Pt to return to using Lt hand as assist for all work related tasks   Time 6   Period Weeks   Status New               Plan - 08/19/16 1309    Clinical Impression Statement Pt progressing toward goal. Reinforced no weights and importance of consistency in HEP.    OT Frequency 2x / week   OT Duration 6 weeks   OT  Treatment/Interventions Self-care/ADL training;Moist Heat;Fluidtherapy;DME and/or AE instruction;Splinting;Patient/family education;Therapeutic exercises;Contrast Bath;Ultrasound;Therapeutic activities;Scar mobilization;Cryotherapy;Passive range of motion;Parrafin;Electrical Stimulation;Manual Therapy   Plan fluidotherapy, AAROM and scar managemen, forearm gym, supinator roller   Consulted and Agree with Plan of Care Patient      Patient will benefit from skilled therapeutic intervention in order to improve the following deficits and impairments:  Decreased range of motion, Increased edema, Impaired sensation, Decreased activity tolerance, Decreased knowledge of precautions, Decreased skin integrity, Impaired UE functional use, Pain, Decreased strength, Impaired perceived functional ability  Visit Diagnosis: Pain in left wrist  Pain in left elbow  Stiffness of left wrist, not elsewhere classified  Localized edema  Muscle weakness (generalized)    Problem List There are no active problems to display for this patient.   Norton Pastel, OTR/L 08/19/2016, 1:12 PM  [facility] [address] [place], [place], [address] Phone: [phone]   Fax:  [phone]  Name: Anthony Barton MRN: [id number] Date of Birth: 1956-04-11

## 2016-08-26 ENCOUNTER — Ambulatory Visit: Payer: BLUE CROSS/BLUE SHIELD | Admitting: Occupational Therapy

## 2016-08-26 DIAGNOSIS — M25532 Pain in left wrist: Secondary | ICD-10-CM | POA: Diagnosis not present

## 2016-08-26 DIAGNOSIS — M25632 Stiffness of left wrist, not elsewhere classified: Secondary | ICD-10-CM

## 2016-08-26 DIAGNOSIS — R600 Localized edema: Secondary | ICD-10-CM

## 2016-08-26 NOTE — Therapy (Signed)
[facility] [address] [place], [place], [address] Phone: [phone]   Fax:  [phone]  Occupational Therapy Treatment  Patient Details  Name: Anthony Barton MRN: [id number] Date of Birth: 1955/08/03 Referring Provider: Dr. Melvyn Novas  Encounter Date: 08/26/2016      OT End of Session - 08/26/16 1009    Visit Number 3   Number of Visits 12   Authorization Type BC/BS? or self pay - pt unsure   OT Start Time 0930   OT Stop Time 1015   OT Time Calculation (min) 45 min   Activity Tolerance Patient tolerated treatment well      Past Medical History:  Diagnosis Date  . Asthma   . GERD (gastroesophageal reflux disease)   . Peripheral neuropathy (HCC)    mild    Past Surgical History:  Procedure Laterality Date  . OPEN REDUCTION INTERNAL FIXATION (ORIF) DISTAL RADIAL FRACTURE Left 07/02/2016   Procedure: OPEN REDUCTION INTERNAL FIXATION (ORIF) DISTAL RADIAL FRACTURE;  Surgeon: Bradly Bienenstock, MD;  Location: [facility];  Service: Orthopedics;  Laterality: Left;  . TONSILLECTOMY      There were no vitals filed for this visit.      Subjective Assessment - 08/26/16 0938    Subjective  I don't have any pain now, just certain movements cause pain   Pertinent History see epic , L colles fx/, s/p ORIF 07/02/2016   Patient Stated Goals Get my motion back in my Lt wrist and go back to the gym again   Currently in Pain? Yes   Pain Score 2    Pain Location Wrist  ulnar side   Pain Orientation Left   Pain Descriptors / Indicators Sharp   Pain Type Acute pain   Pain Onset More than a month ago   Pain Frequency Intermittent   Aggravating Factors  certain movements   Pain Relieving Factors rest, heat                      OT Treatments/Exercises (OP) - 08/26/16 0001      Wrist Exercises   Other wrist exercises Reviewed A/ROM HEP and emphasized doing at least 4x/day (before work, during lunch break, right  after work, and right before bedtime). Pt performed wrist flex, ext, RD, UD, and sup/pronation to x 15 reps each   Other wrist exercises Forearm gym x 4 full revolutions for combined wrist and forearm movement. Supinator roller x 10 reps for sup/pron. Wrist winder (without weight) x 2 full windups     LUE Fluidotherapy   Number Minutes Fluidotherapy 10 Minutes   LUE Fluidotherapy Location Hand;Wrist   Comments at beginning of session to decr. stiffness                  OT Short Term Goals - 08/19/16 1309      OT SHORT TERM GOAL #1   Title Independent with initial A/ROM HEP    Time 3   Period Weeks   Status Achieved     OT SHORT TERM GOAL #2   Title Independent with scar massage   Time 3   Period Weeks   Status Achieved           OT Long Term Goals - 08/19/16 1309      OT LONG TERM GOAL #1   Title Independent with updated HEP    Time 6   Period Weeks   Status New     OT  LONG TERM GOAL #2   Title Pt to increase wrist flexion and extension by 10* or greater for functional tasks   Time 6   Period Weeks   Status New     OT LONG TERM GOAL #3   Title Pt to demo 50% or greater grip strength Lt hand as compared to that of Rt hand for work related tasks   Time 6   Period Weeks   Status New     OT LONG TERM GOAL #4   Title Pt to report pain no greater than 2/10 with lifting tasks   Baseline     Time 6   Period Weeks   Status New     OT LONG TERM GOAL #5   Title Pt to return to using Lt hand as assist for all work related tasks   Time 6   Period Weeks   Status New               Plan - 08/26/16 1010    Clinical Impression Statement Pt making progress with ROM Lt wrist and only mild pain at times.    OT Frequency 2x / week   OT Duration 6 weeks   OT Treatment/Interventions Self-care/ADL training;Moist Heat;Fluidtherapy;DME and/or AE instruction;Splinting;Patient/family education;Therapeutic exercises;Contrast Bath;Ultrasound;Therapeutic  activities;Scar mobilization;Cryotherapy;Passive range of motion;Parrafin;Electrical Stimulation;Manual Therapy   Plan fluidotherapy, begin P/ROM and light strengthening with putty and 1-2 lb dumbbell per protocol (as pt will be 8 weeks post-op tomorrow)    Consulted and Agree with Plan of Care Patient      Patient will benefit from skilled therapeutic intervention in order to improve the following deficits and impairments:  Decreased range of motion, Increased edema, Impaired sensation, Decreased activity tolerance, Decreased knowledge of precautions, Decreased skin integrity, Impaired UE functional use, Pain, Decreased strength, Impaired perceived functional ability  Visit Diagnosis: Pain in left wrist  Stiffness of left wrist, not elsewhere classified  Localized edema    Problem List There are no active problems to display for this patient.   Kelli ChurnBallie, Kameran Lallier Johnson, OTR/L 08/26/2016, 10:15 AM  [facility] [address] [place], [place], [address] Phone: [phone]   Fax:  [phone]  Name: Anthony Barton MRN: [id number] Date of Birth: 08/11/1955

## 2016-09-02 ENCOUNTER — Ambulatory Visit: Payer: BLUE CROSS/BLUE SHIELD | Attending: Orthopedic Surgery | Admitting: Occupational Therapy

## 2016-09-02 DIAGNOSIS — M6281 Muscle weakness (generalized): Secondary | ICD-10-CM | POA: Diagnosis present

## 2016-09-02 DIAGNOSIS — M25632 Stiffness of left wrist, not elsewhere classified: Secondary | ICD-10-CM | POA: Diagnosis present

## 2016-09-02 DIAGNOSIS — M25532 Pain in left wrist: Secondary | ICD-10-CM | POA: Diagnosis present

## 2016-09-02 NOTE — Therapy (Signed)
[place] [address] [place], [place], [address] Phone: [phone]   Fax:  [phone]  Occupational Therapy Treatment  Patient Details  Name: Anthony Barton MRN: [id number] Date of Birth: 1955/12/31 Referring Provider: Dr. Caralyn Guile  Encounter Date: 09/02/2016      OT End of Session - 09/02/16 1015    Visit Number 4   Number of Visits 12   Authorization Type BC/BS? or self pay - pt unsure   OT Start Time 0930   OT Stop Time 1015   OT Time Calculation (min) 45 min   Equipment Utilized During Treatment fluidotherapy   Activity Tolerance Patient tolerated treatment well      Past Medical History:  Diagnosis Date  . Asthma   . GERD (gastroesophageal reflux disease)   . Peripheral neuropathy (HCC)    mild    Past Surgical History:  Procedure Laterality Date  . OPEN REDUCTION INTERNAL FIXATION (ORIF) DISTAL RADIAL FRACTURE Left 07/02/2016   Procedure: OPEN REDUCTION INTERNAL FIXATION (ORIF) DISTAL RADIAL FRACTURE;  Surgeon: Iran Planas, MD;  Location: [place];  Service: Orthopedics;  Laterality: Left;  . TONSILLECTOMY      There were no vitals filed for this visit.      Subjective Assessment - 09/02/16 0937    Subjective  The doctor said it was healing nicely, but not to lift more than 5-7 lbs.    Pertinent History see epic , L colles fx/, s/p ORIF 07/02/2016   Patient Stated Goals Get my motion back in my Lt wrist and go back to the gym again   Currently in Pain? No/denies            [facility] OT Assessment - 09/02/16 0001      AROM   Overall AROM Comments supination = 85*, wrist flex = 52*, ext = 55*, RD = 18*, UD = 30*     Hand Function   Right Hand Grip (lbs) 85 lbs   Left Hand Grip (lbs) 45 lbs                  OT Treatments/Exercises (OP) - 09/02/16 0001      Exercises   Exercises Hand     Wrist Exercises   Other wrist exercises P/ROM HEP issued for wrist flex, ext, and  supination - see pt instructions for details   Other wrist exercises Wrist strengthening HEP issued for wrist flex, ext, and RD - See pt instructions for details. Also performed hammer stretch in sup/pron. x 10 reps holding 5 sec. each way. Forearm gym x 4 full revolutions     Hand Exercises   Theraputty - Grip x 15 reps (issued as HEP). Issued red putty for home use   Theraputty - Pinch x 10 reps (issued as HEP)     LUE Fluidotherapy   Number Minutes Fluidotherapy 12 Minutes   LUE Fluidotherapy Location Hand;Wrist   Comments at beginning of session to decr. stiffness and for desensitization                OT Education - 09/02/16 0948    Education provided Yes   Education Details P/ROM HEP, strengthening HEP for wrist and hand   Person(s) Educated Patient   Methods Explanation;Demonstration;Handout   Comprehension Verbalized understanding;Returned demonstration          OT Short Term Goals - 08/19/16 1309      OT SHORT TERM GOAL #1   Title Independent with initial A/ROM  HEP    Time 3   Period Weeks   Status Achieved     OT SHORT TERM GOAL #2   Title Independent with scar massage   Time 3   Period Weeks   Status Achieved           OT Long Term Goals - 09/02/16 1016      OT LONG TERM GOAL #1   Title Independent with updated HEP    Time 6   Period Weeks   Status On-going     OT LONG TERM GOAL #2   Title Pt to increase wrist flexion and extension by 10* or greater for functional tasks   Time 6   Period Weeks   Status On-going  met in extension, improving in flex     OT LONG TERM GOAL #3   Title Pt to demo 50% or greater grip strength Lt hand as compared to that of Rt hand for work related tasks   Time 6   Period Weeks   Status On-going     OT LONG TERM GOAL #4   Title Pt to report pain no greater than 2/10 with lifting tasks   Baseline     Time 6   Period Weeks   Status On-going     OT LONG TERM GOAL #5   Title Pt to return to using Lt hand  as assist for all work related tasks   Time 6   Period Weeks   Status On-going               Plan - 09/02/16 1017    Clinical Impression Statement Pt progressing per protocol with no incr. in pain or complications. Pt has improved in wrist flexion and extension   Rehab Potential Good   OT Frequency 2x / week   OT Duration 6 weeks   OT Treatment/Interventions Self-care/ADL training;Moist Heat;Fluidtherapy;DME and/or AE instruction;Splinting;Patient/family education;Therapeutic exercises;Contrast Bath;Ultrasound;Therapeutic activities;Scar mobilization;Cryotherapy;Passive range of motion;Parrafin;Electrical Stimulation;Manual Therapy   Plan continue fluido, review P/ROM and strengthening HEP, wrist winder with 2 lbs, hammer stretch (next week - update to green putty)    Consulted and Agree with Plan of Care Patient      Patient will benefit from skilled therapeutic intervention in order to improve the following deficits and impairments:  Decreased range of motion, Increased edema, Impaired sensation, Decreased activity tolerance, Decreased knowledge of precautions, Decreased skin integrity, Impaired UE functional use, Pain, Decreased strength, Impaired perceived functional ability  Visit Diagnosis: Stiffness of left wrist, not elsewhere classified  Muscle weakness (generalized)    Problem List There are no active problems to display for this patient.   Carey Bullocks, OTR/L 09/02/2016, 10:19 AM  [place] [address] [place], [place], [address] Phone: [phone]   Fax:  [phone]  Name: Anthony Barton MRN: [id number] Date of Birth: 1955-09-29

## 2016-09-02 NOTE — Patient Instructions (Signed)
   PROM: Wrist Flexion / Extension   Grasp  hand and slowly bend wrist until stretch is felt. Relax. Then stretch as far as possible in opposite direction. Be sure to keep elbow bent.  Hold __10__ sec. each way Repeat _5___ times per set.    Do _4-6___ sessions per day. (Can do "prayer stretch" in place of top picture. Have elbow straight for bottom picture)     Supination (Passive)   Keep elbow bent at right angle and held firmly at side. Use other hand to turn forearm until palm faces upward. Hold __10__ seconds. Repeat __5__ times. Do _4-6___ sessions per day.   Extension (Resistive)    With wrist over edge of table, lift _1-2 lbs, keeping arm on table surface. Hold _3___ seconds. Lower slowly. Repeat __10-15__ times. Do __2__ sessions per day.   Flexion (Resistive)    With hand palm-up and holding _1-2 lbs, bend hand toward you at wrist. Hold _3___ seconds. Relax slowly. Repeat _10-15___ times. Do _2___ sessions per day.   Wrist Radial Deviation: Resisted    With left thumb up, _1-2___ pound weight in hand, bend wrist up. Return slowly. Repeat _10-15___ times per set.  Do __2__ sessions per day.    1. Grip Strengthening (Resistive Putty)   Squeeze putty using thumb and all fingers. Repeat _15__ times. Do __2__ sessions per day.   2. Roll putty into tube on table and pinch between each finger and thumb x 10 reps each. (Do ring and small finger together)

## 2016-09-09 ENCOUNTER — Ambulatory Visit: Payer: BLUE CROSS/BLUE SHIELD | Admitting: Occupational Therapy

## 2016-09-09 DIAGNOSIS — M6281 Muscle weakness (generalized): Secondary | ICD-10-CM

## 2016-09-09 DIAGNOSIS — M25632 Stiffness of left wrist, not elsewhere classified: Secondary | ICD-10-CM

## 2016-09-09 DIAGNOSIS — M25532 Pain in left wrist: Secondary | ICD-10-CM

## 2016-09-09 NOTE — Therapy (Signed)
[facility] [address] [place], [place], [address] Phone: [phone]   Fax:  [phone]  Occupational Therapy Treatment  Patient Details  Name: Anthony Barton MRN: [id number] Date of Birth: 1956/05/20 Referring Provider: Dr. Caralyn Guile  Encounter Date: 09/09/2016      OT End of Session - 09/09/16 1010    Visit Number 5   Number of Visits 12   Authorization Type BC/BS? or self pay - pt unsure   OT Start Time 0935   OT Stop Time 1015   OT Time Calculation (min) 40 min   Equipment Utilized During Treatment fluidotherapy   Activity Tolerance Patient tolerated treatment well      Past Medical History:  Diagnosis Date  . Asthma   . GERD (gastroesophageal reflux disease)   . Peripheral neuropathy (HCC)    mild    Past Surgical History:  Procedure Laterality Date  . OPEN REDUCTION INTERNAL FIXATION (ORIF) DISTAL RADIAL FRACTURE Left 07/02/2016   Procedure: OPEN REDUCTION INTERNAL FIXATION (ORIF) DISTAL RADIAL FRACTURE;  Surgeon: Iran Planas, MD;  Location: [facility];  Service: Orthopedics;  Laterality: Left;  . TONSILLECTOMY      There were no vitals filed for this visit.      Subjective Assessment - 09/09/16 0943    Subjective  I'm doing my exercises   Pertinent History see epic , L colles fx/, s/p ORIF 07/02/2016   Patient Stated Goals Get my motion back in my Lt wrist and go back to the gym again   Currently in Pain? Yes   Pain Score 2    Pain Location Wrist  radial side today   Pain Orientation Left   Pain Descriptors / Indicators Sore   Pain Type Acute pain   Pain Onset More than a month ago   Pain Frequency Intermittent   Aggravating Factors  certain movements   Pain Relieving Factors rest, heat                      OT Treatments/Exercises (OP) - 09/09/16 0001      Wrist Exercises   Other wrist exercises Reviewed P/ROM and strengthening HEP for wrist/forearm - pt did each as  indicated   Other wrist exercises Wrist winder x 4 full revolutions with 2 lb. weight      Hand Exercises   Other Hand Exercises Updated putty HEP to green resistance and issued green putty for home use     LUE Fluidotherapy   Number Minutes Fluidotherapy 10 Minutes   LUE Fluidotherapy Location Hand;Wrist   Comments at beginning of session to decr. stiffness and mild pain                  OT Short Term Goals - 08/19/16 1309      OT SHORT TERM GOAL #1   Title Independent with initial A/ROM HEP    Time 3   Period Weeks   Status Achieved     OT SHORT TERM GOAL #2   Title Independent with scar massage   Time 3   Period Weeks   Status Achieved           OT Long Term Goals - 09/09/16 1011      OT LONG TERM GOAL #1   Title Independent with updated HEP    Time 6   Period Weeks   Status Achieved     OT LONG TERM GOAL #2   Title Pt to increase  wrist flexion and extension by 10* or greater for functional tasks   Time 6   Period Weeks   Status On-going  met in extension, improving in flex     OT LONG TERM GOAL #3   Title Pt to demo 50% or greater grip strength Lt hand as compared to that of Rt hand for work related tasks   Time 6   Period Weeks   Status Achieved  09/02/16: Rt = 85 lbs, Lt = 45 lbs     OT LONG TERM GOAL #4   Title Pt to report pain no greater than 2/10 with lifting tasks   Baseline     Time 6   Period Weeks   Status On-going     OT LONG TERM GOAL #5   Title Pt to return to using Lt hand as assist for all work related tasks   Time 6   Period Weeks   Status On-going               Plan - 09/09/16 1012    Clinical Impression Statement Pt met LTG's #1 and #3. Pt approximating remaining LTG's   Rehab Potential Good   OT Frequency 2x / week   OT Duration 6 weeks   OT Treatment/Interventions Self-care/ADL training;Moist Heat;Fluidtherapy;DME and/or AE instruction;Splinting;Patient/family education;Therapeutic exercises;Contrast  Bath;Ultrasound;Therapeutic activities;Scar mobilization;Cryotherapy;Passive range of motion;Parrafin;Electrical Stimulation;Manual Therapy   Plan pt to return in 2 weeks for last appt - gripper activity, re-assess grip strength, check remaining goals and d/c   Consulted and Agree with Plan of Care Patient      Patient will benefit from skilled therapeutic intervention in order to improve the following deficits and impairments:  Decreased range of motion, Increased edema, Impaired sensation, Decreased activity tolerance, Decreased knowledge of precautions, Decreased skin integrity, Impaired UE functional use, Pain, Decreased strength, Impaired perceived functional ability  Visit Diagnosis: Stiffness of left wrist, not elsewhere classified  Muscle weakness (generalized)  Pain in left wrist    Problem List There are no active problems to display for this patient.   Carey Bullocks, OTR/L 09/09/2016, 10:15 AM  [facility] [address] [place], [place], [address] Phone: [phone]   Fax:  [phone]  Name: Anthony Barton MRN: [id number] Date of Birth: 1956/03/09

## 2016-09-23 ENCOUNTER — Ambulatory Visit: Payer: BLUE CROSS/BLUE SHIELD | Admitting: Occupational Therapy

## 2016-09-23 DIAGNOSIS — M25632 Stiffness of left wrist, not elsewhere classified: Secondary | ICD-10-CM

## 2016-09-23 DIAGNOSIS — M6281 Muscle weakness (generalized): Secondary | ICD-10-CM

## 2016-09-23 NOTE — Therapy (Signed)
[facility] [address] [place], [place], [address] Phone: [phone]   Fax:  [phone]  Occupational Therapy Treatment  Patient Details  Name: Anthony Barton MRN: [id number] Date of Birth: Nov 27, 1955 Referring Provider: Dr. Caralyn Guile  Encounter Date: 09/23/2016      OT End of Session - 09/23/16 1058    Visit Number 6   Number of Visits 12   Authorization Type BC/BS? or self pay - pt unsure   OT Start Time 1020   OT Stop Time 1100   OT Time Calculation (min) 40 min   Activity Tolerance Patient tolerated treatment well      Past Medical History:  Diagnosis Date  . Asthma   . GERD (gastroesophageal reflux disease)   . Peripheral neuropathy (HCC)    mild    Past Surgical History:  Procedure Laterality Date  . OPEN REDUCTION INTERNAL FIXATION (ORIF) DISTAL RADIAL FRACTURE Left 07/02/2016   Procedure: OPEN REDUCTION INTERNAL FIXATION (ORIF) DISTAL RADIAL FRACTURE;  Surgeon: Iran Planas, MD;  Location: [facility];  Service: Orthopedics;  Laterality: Left;  . TONSILLECTOMY      There were no vitals filed for this visit.      Subjective Assessment - 09/23/16 1026    Subjective  I really haven't had any pain   Pertinent History see epic , L colles fx/, s/p ORIF 07/02/2016   Patient Stated Goals Get my motion back in my Lt wrist and go back to the gym again   Currently in Pain? No/denies            [facility] OT Assessment - 09/23/16 0001      AROM   Overall AROM Comments Lt wrist flex = 60*, ext = 70*, RD = 18*, 38*     Hand Function   Left Hand Grip (lbs) 62                  OT Treatments/Exercises (OP) - 09/23/16 0001      ADLs   ADL Comments Assessed LTG's and progress to date and reviewed progress with patient     Wrist Exercises   Other wrist exercises Wrist flex, ext, and RD x 15 reps each with 3 lb. weight     Hand Exercises   Other Hand Exercises Gripper set at level 3 resistance  to pick up blocks for sustained grip strength with mod drops and difficulty as pt fatigues   Other Hand Exercises Pinch strength activity with clothespins yellow to black resistance d/t thumb weakness                  OT Short Term Goals - 08/19/16 1309      OT SHORT TERM GOAL #1   Title Independent with initial A/ROM HEP    Time 3   Period Weeks   Status Achieved     OT SHORT TERM GOAL #2   Title Independent with scar massage   Time 3   Period Weeks   Status Achieved           OT Long Term Goals - 09/23/16 1047      OT LONG TERM GOAL #1   Title Independent with updated HEP    Time 6   Period Weeks   Status Achieved     OT LONG TERM GOAL #2   Title Pt to increase wrist flexion and extension by 10* or greater for functional tasks   Time 6   Period Weeks  Status Achieved  09/23/16: wrist flex = 60*, ext = 70*     OT LONG TERM GOAL #3   Title Pt to demo 50% or greater grip strength Lt hand as compared to that of Rt hand for work related tasks   Time 6   Period Weeks   Status Achieved  09/02/16: Rt = 85 lbs, Lt = 45 lbs. 09/23/16: Lt = 62 lbs     OT LONG TERM GOAL #4   Title Pt to report pain no greater than 2/10 with lifting tasks   Baseline     Time 6   Period Weeks   Status Achieved     OT LONG TERM GOAL #5   Title Pt to return to using Lt hand as assist for all work related tasks   Time 6   Period Weeks   Status Achieved  except lifting large paint buckets Lt hand               Plan - 09/23/16 1059    Clinical Impression Statement Pt met all LTG's. Pt has progressed with Lt wrist ROM, and wrist and hand strength.    OT Treatment/Interventions Self-care/ADL training;Moist Heat;Fluidtherapy;DME and/or AE instruction;Splinting;Patient/family education;Therapeutic exercises;Contrast Bath;Ultrasound;Therapeutic activities;Scar mobilization;Cryotherapy;Passive range of motion;Parrafin;Electrical Stimulation;Manual Therapy   Plan D/C O.T.     Consulted and Agree with Plan of Care Patient      Patient will benefit from skilled therapeutic intervention in order to improve the following deficits and impairments:  Decreased range of motion, Increased edema, Impaired sensation, Decreased activity tolerance, Decreased knowledge of precautions, Decreased skin integrity, Impaired UE functional use, Pain, Decreased strength, Impaired perceived functional ability  Visit Diagnosis: Stiffness of left wrist, not elsewhere classified  Muscle weakness (generalized)    Problem List There are no active problems to display for this patient.  OCCUPATIONAL THERAPY DISCHARGE SUMMARY  Visits from Start of Care: 6  Current functional level related to goals / functional outcomes: SEE ABOVE   Remaining deficits: Mild strength deficits Lt wrist and hand   Education / Equipment: HEP's, scar massage, initial precautions  Plan: Patient agrees to discharge.  Patient goals were met. Patient is being discharged due to meeting the stated rehab goals.  ?????        Carey Bullocks, OTR/L 09/23/2016, 11:00 AM  [facility] [address] [place], [place], [address] Phone: [phone]   Fax:  [phone]  Name: Anthony Barton MRN: [id number] Date of Birth: 1955/11/28

## 2017-12-18 IMAGING — DX DG WRIST COMPLETE 3+V*L*
4 series · 4 of 4 positions shown · non-contrast
Comparison: None.

CLINICAL DATA: Left wrist pain after fall.

EXAM:
LEFT WRIST - COMPLETE 3+ VIEW

[wrist pa]
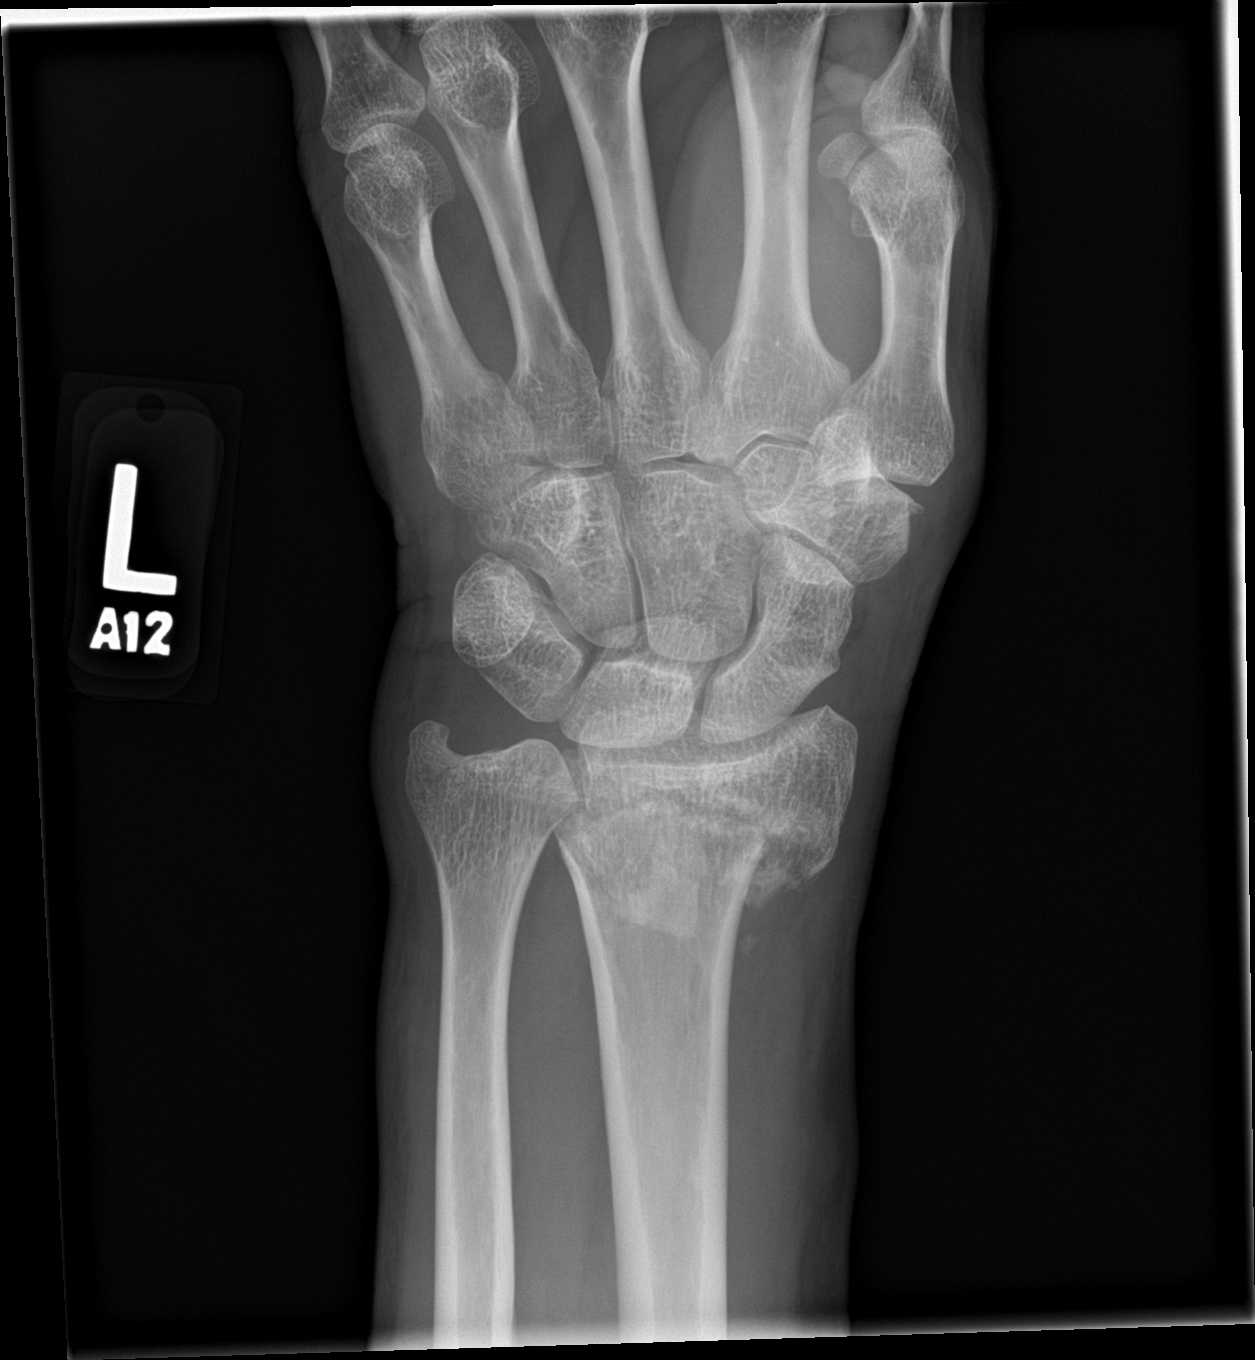

[wrist obl]
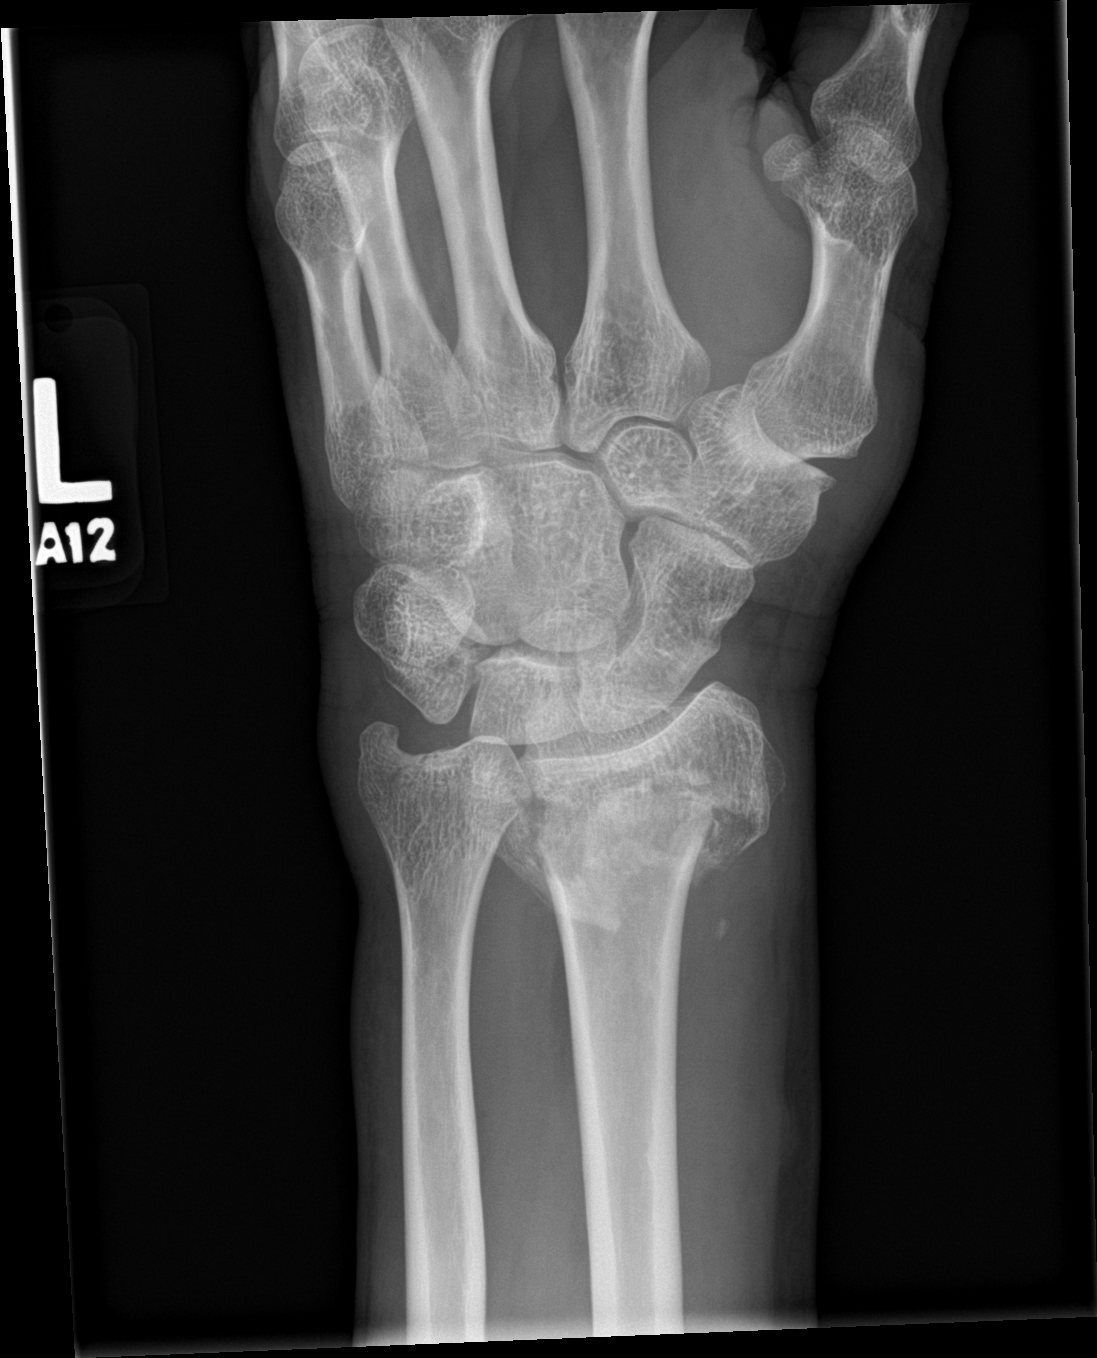

[wrist lat]
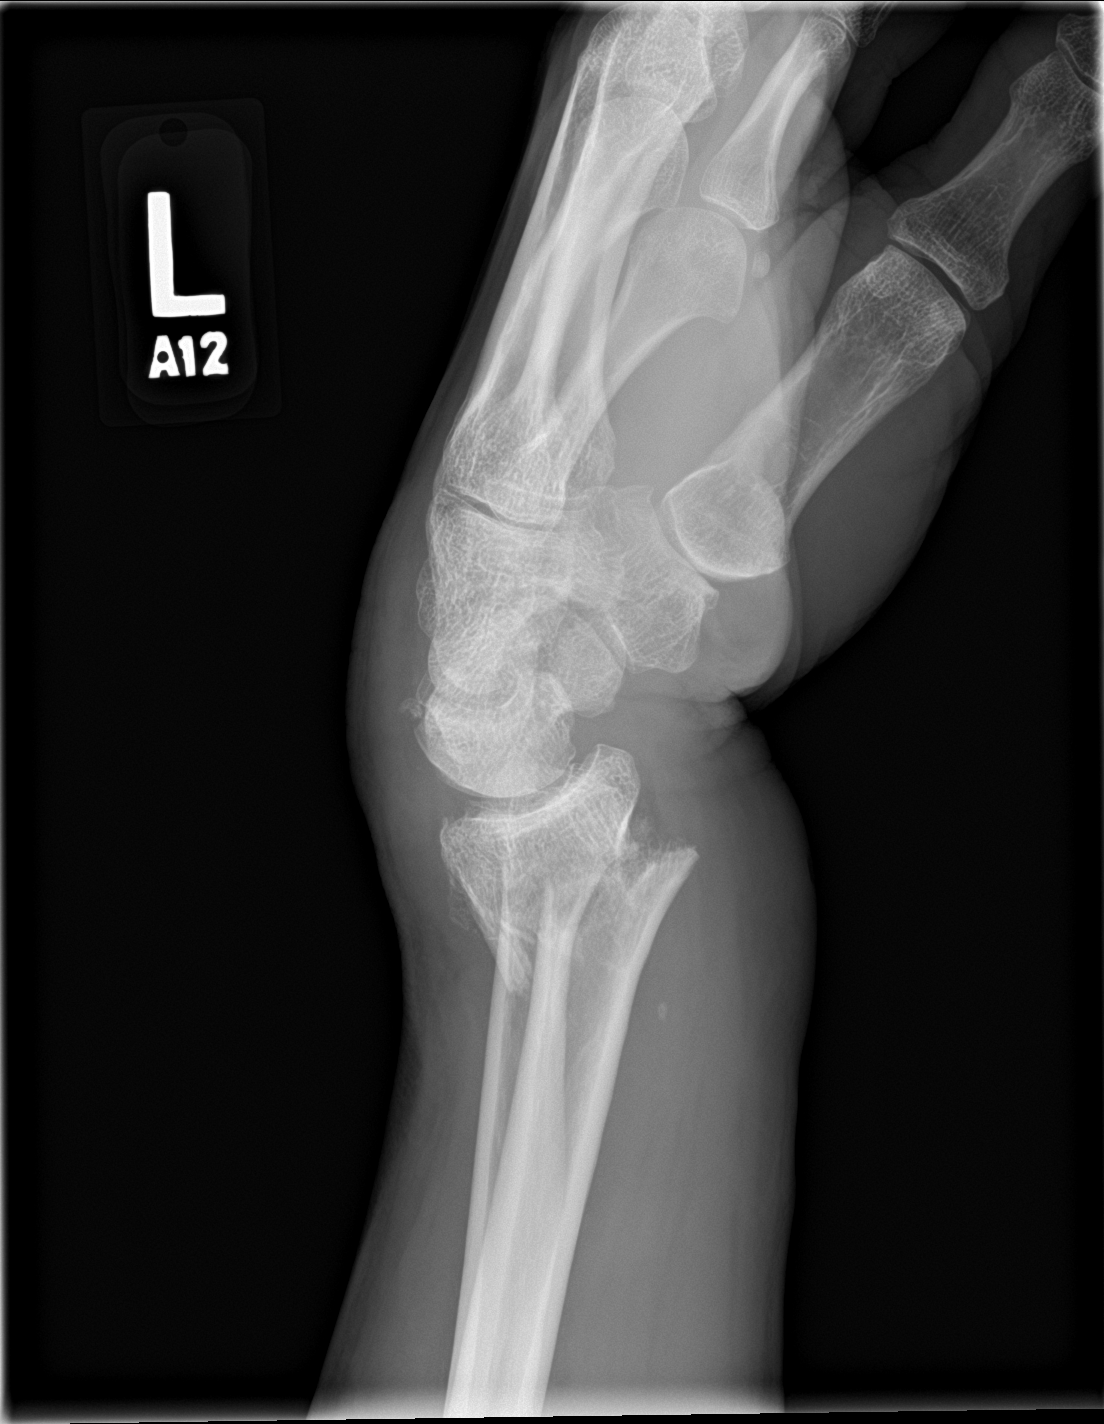

[wrist navicular]
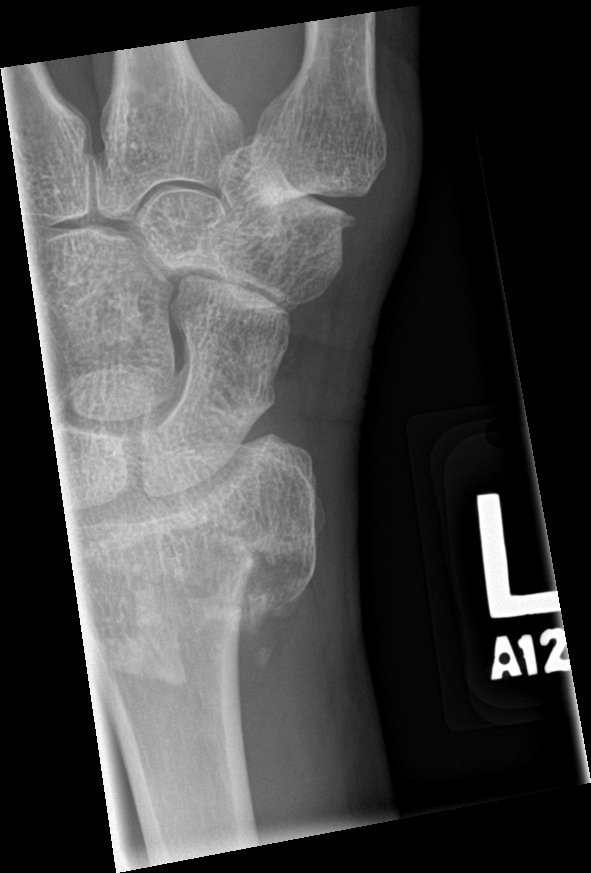

[4 of 4 positions shown; findings below may reference images not displayed]

FINDINGS: Comminuted fracture is seen involving the distal left radius with
moderate dorsal angulation. No other definite fracture or
dislocation is noted. Joint spaces are intact.
IMPRESSION: Comminuted distal left radial fracture with dorsal angulation.
